# Patient Record
Sex: Male | Born: 1975 | Race: Black or African American | Hispanic: No | Marital: Single | Smoking: Current every day smoker
Health system: Southern US, Community
[De-identification: ages and names within clinical notes are randomized; demographics above are authoritative.]

## PROBLEM LIST (undated history)

## (undated) ENCOUNTER — Emergency Department: Discharge status: Absent without leave | Payer: 59

---

## 2002-04-21 ENCOUNTER — Emergency Department (HOSPITAL_COMMUNITY): Admission: EM | Admit: 2002-04-21 | Discharge: 2002-04-21 | Payer: Self-pay | Admitting: Emergency Medicine

## 2014-02-25 ENCOUNTER — Emergency Department (HOSPITAL_COMMUNITY)
Admission: EM | Admit: 2014-02-25 | Discharge: 2014-02-25 | Disposition: A | Discharge status: Home / Self Care | Payer: 59 | Attending: Emergency Medicine | Admitting: Emergency Medicine

## 2014-02-25 ENCOUNTER — Encounter (HOSPITAL_COMMUNITY): Payer: Self-pay | Admitting: Emergency Medicine

## 2014-02-25 ENCOUNTER — Emergency Department (HOSPITAL_COMMUNITY): Payer: 59

## 2014-02-25 DIAGNOSIS — T07XXXA Unspecified multiple injuries, initial encounter: Secondary | ICD-10-CM

## 2014-02-25 DIAGNOSIS — S022XXA Fracture of nasal bones, initial encounter for closed fracture: Secondary | ICD-10-CM | POA: Insufficient documentation

## 2014-02-25 DIAGNOSIS — IMO0002 Reserved for concepts with insufficient information to code with codable children: Secondary | ICD-10-CM | POA: Insufficient documentation

## 2014-02-25 DIAGNOSIS — S0510XA Contusion of eyeball and orbital tissues, unspecified eye, initial encounter: Secondary | ICD-10-CM | POA: Insufficient documentation

## 2014-02-25 DIAGNOSIS — F17200 Nicotine dependence, unspecified, uncomplicated: Secondary | ICD-10-CM | POA: Insufficient documentation

## 2014-02-25 DIAGNOSIS — S00532A Contusion of oral cavity, initial encounter: Secondary | ICD-10-CM

## 2014-02-25 DIAGNOSIS — Z23 Encounter for immunization: Secondary | ICD-10-CM | POA: Insufficient documentation

## 2014-02-25 DIAGNOSIS — S1093XA Contusion of unspecified part of neck, initial encounter: Principal | ICD-10-CM

## 2014-02-25 DIAGNOSIS — S0511XA Contusion of eyeball and orbital tissues, right eye, initial encounter: Secondary | ICD-10-CM

## 2014-02-25 DIAGNOSIS — S0512XA Contusion of eyeball and orbital tissues, left eye, initial encounter: Secondary | ICD-10-CM

## 2014-02-25 DIAGNOSIS — S0003XA Contusion of scalp, initial encounter: Secondary | ICD-10-CM | POA: Insufficient documentation

## 2014-02-25 DIAGNOSIS — S0083XA Contusion of other part of head, initial encounter: Principal | ICD-10-CM | POA: Insufficient documentation

## 2014-02-25 MED ORDER — HYDROCODONE-ACETAMINOPHEN 5-325 MG PO TABS
1.0000 | ORAL_TABLET | ORAL | Status: DC | PRN
  Administered 2014-02-25: 2 via ORAL
  Filled 2014-02-25: qty 2

## 2014-02-25 MED ORDER — TETANUS-DIPHTH-ACELL PERTUSSIS 5-2.5-18.5 LF-MCG/0.5 IM SUSP
0.5000 mL | Freq: Once | INTRAMUSCULAR | Status: AC
  Administered 2014-02-25: 0.5 mL via INTRAMUSCULAR
  Filled 2014-02-25: qty 0.5

## 2014-02-25 MED ORDER — HYDROCODONE-ACETAMINOPHEN 5-325 MG PO TABS: 1.0000 | ORAL_TABLET | ORAL | Status: AC | PRN
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
## 2016-07-09 ENCOUNTER — Emergency Department (HOSPITAL_COMMUNITY): Payer: 59

## 2016-07-09 ENCOUNTER — Encounter (HOSPITAL_COMMUNITY): Payer: Self-pay | Admitting: Emergency Medicine

## 2016-07-09 ENCOUNTER — Emergency Department (HOSPITAL_COMMUNITY)
Admission: EM | Admit: 2016-07-09 | Discharge: 2016-07-09 | Disposition: A | Discharge status: Home / Self Care | Payer: 59 | Attending: Emergency Medicine | Admitting: Emergency Medicine

## 2016-07-09 DIAGNOSIS — I951 Orthostatic hypotension: Secondary | ICD-10-CM

## 2016-07-09 DIAGNOSIS — R55 Syncope and collapse: Secondary | ICD-10-CM | POA: Diagnosis present

## 2016-07-09 DIAGNOSIS — E860 Dehydration: Secondary | ICD-10-CM | POA: Diagnosis not present

## 2016-07-09 DIAGNOSIS — F17200 Nicotine dependence, unspecified, uncomplicated: Secondary | ICD-10-CM | POA: Insufficient documentation

## 2016-07-09 LAB — CBC WITH DIFFERENTIAL/PLATELET
Basophils Absolute: 0 10*3/uL (ref 0.0–0.1)
Basophils Relative: 0 %
EOS ABS: 0.1 10*3/uL (ref 0.0–0.7)
Eosinophils Relative: 1 %
HEMATOCRIT: 39.7 % (ref 39.0–52.0)
HEMOGLOBIN: 12.8 g/dL — AB (ref 13.0–17.0)
Lymphocytes Relative: 20 %
Lymphs Abs: 1.9 10*3/uL (ref 0.7–4.0)
MCH: 29 pg (ref 26.0–34.0)
MCHC: 32.2 g/dL (ref 30.0–36.0)
MCV: 89.8 fL (ref 78.0–100.0)
MONOS PCT: 7 %
Monocytes Absolute: 0.7 10*3/uL (ref 0.1–1.0)
NEUTROS ABS: 6.9 10*3/uL (ref 1.7–7.7)
NEUTROS PCT: 72 %
Platelets: 214 10*3/uL (ref 150–400)
RBC: 4.42 MIL/uL (ref 4.22–5.81)
RDW: 13.3 % (ref 11.5–15.5)
WBC: 9.6 10*3/uL (ref 4.0–10.5)

## 2016-07-09 LAB — URINALYSIS, ROUTINE W REFLEX MICROSCOPIC
GLUCOSE, UA: NEGATIVE mg/dL
Hgb urine dipstick: NEGATIVE
KETONES UR: 15 mg/dL — AB
LEUKOCYTES UA: NEGATIVE
Nitrite: NEGATIVE
Protein, ur: 30 mg/dL — AB
Specific Gravity, Urine: 1.025 (ref 1.005–1.030)
pH: 5.5 (ref 5.0–8.0)

## 2016-07-09 LAB — COMPREHENSIVE METABOLIC PANEL
ALBUMIN: 4.1 g/dL (ref 3.5–5.0)
ALK PHOS: 58 U/L (ref 38–126)
ALT: 22 U/L (ref 17–63)
AST: 25 U/L (ref 15–41)
Anion gap: 9 (ref 5–15)
BILIRUBIN TOTAL: 0.5 mg/dL (ref 0.3–1.2)
BUN: 18 mg/dL (ref 6–20)
CALCIUM: 8.4 mg/dL — AB (ref 8.9–10.3)
CO2: 22 mmol/L (ref 22–32)
CREATININE: 1.39 mg/dL — AB (ref 0.61–1.24)
Chloride: 104 mmol/L (ref 101–111)
GFR calc Af Amer: >60 mL/min (ref >60)
GFR calc non Af Amer: >60 mL/min (ref >60)
GLUCOSE: 105 mg/dL — AB (ref 65–99)
Potassium: 3.1 mmol/L — ABNORMAL LOW (ref 3.5–5.1)
SODIUM: 135 mmol/L (ref 135–145)
Total Protein: 6.3 g/dL — ABNORMAL LOW (ref 6.5–8.1)

## 2016-07-09 LAB — MAGNESIUM: Magnesium: 2.1 mg/dL (ref 1.7–2.4)

## 2016-07-09 LAB — URINE MICROSCOPIC-ADD ON
Bacteria, UA: NONE SEEN
RBC / HPF: NONE SEEN RBC/hpf (ref 0–5)

## 2016-07-09 LAB — CBG MONITORING, ED: Glucose-Capillary: 110 mg/dL — ABNORMAL HIGH (ref 65–99)

## 2016-07-09 MED ORDER — SODIUM CHLORIDE 0.9 % IV BOLUS (SEPSIS)
1000.0000 mL | Freq: Once | INTRAVENOUS | Status: AC
  Administered 2016-07-09: 1000 mL via INTRAVENOUS

## 2016-07-09 MED ORDER — LACTATED RINGERS IV BOLUS (SEPSIS)
1000.0000 mL | Freq: Once | INTRAVENOUS | Status: AC
  Administered 2016-07-09: 1000 mL via INTRAVENOUS
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
## 2016-07-09 NOTE — ED Notes (Signed)
Report received from WelcomeLynnze, RN at this time. I agree with previous nurses assessment with no changes noted.

## 2016-07-09 NOTE — ED Notes (Signed)
Pt able to ambulate efficently with no discomfort

## 2016-07-09 NOTE — ED Notes (Signed)
Pt provided with d/c instructions at this time. Pt verbalizes understanding of d/c instructions as well as follow up procedure after d/c. No new RX at time of d/c.  Pt in no apparent distress at this time. Pt ambulatory at time of d/c.   

## 2016-07-09 NOTE — ED Notes (Signed)
Pt returned from CT at this time via ED stretcher. Pt in no apparent distress at this time. Will continue to closely monitor pt.   

## 2016-07-09 NOTE — ED Notes (Signed)
CBG 110. 
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
## 2016-07-09 NOTE — ED Notes (Signed)
Patient transported to CT at this time via ED stretcher. Pt in no apparent distress at this time.   
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
## 2017-09-25 IMAGING — CT CT HEAD W/O CM
3 of 4 series · 18 of 47 positions shown, 21 images · non-contrast
Comparison: Prior CT scan of the head and face 02/25/2014

CLINICAL DATA: 39-year-old male with syncopal episode

EXAM:
CT HEAD WITHOUT CONTRAST
TECHNIQUE: Contiguous axial images were obtained from the base of the skull
through the vertex without intravenous contrast.

[Series 201: head w/o, idose (1) · axial · non-contrast · 0.45mm/px · z∈[+145,+270]mm · 12 of 31 slices shown, 15 images]
[im 3/31  brain]
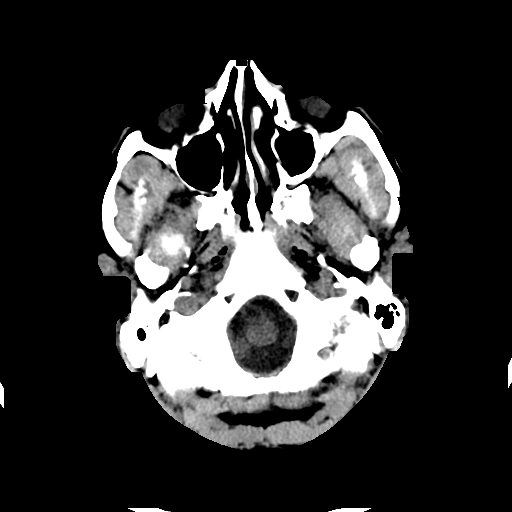
[im 3/31  bone]
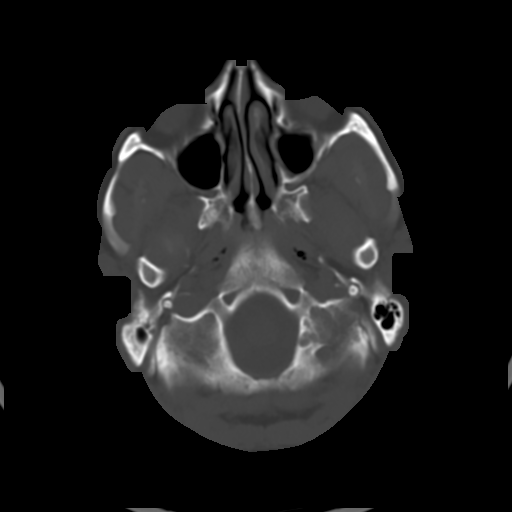
[im 5/31  brain]
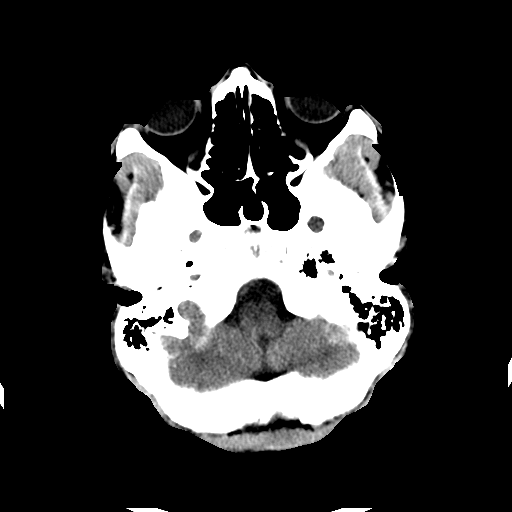
[im 7/31  brain]
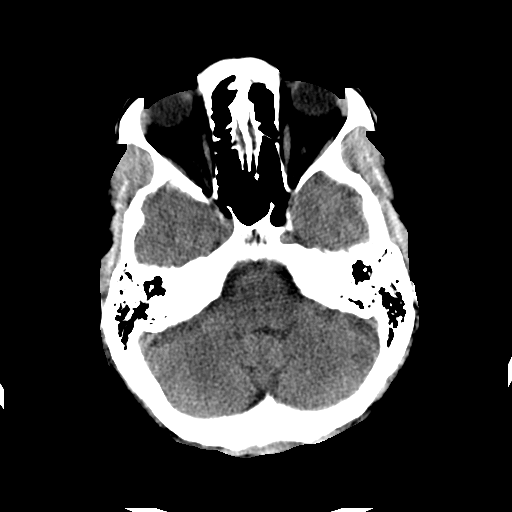
[im 9/31  brain]
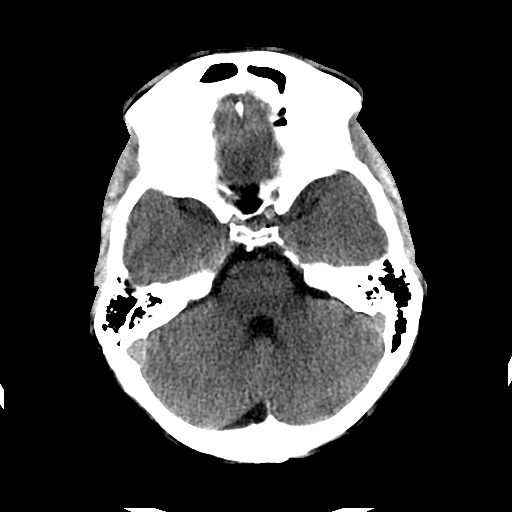
[im 11/31  brain]
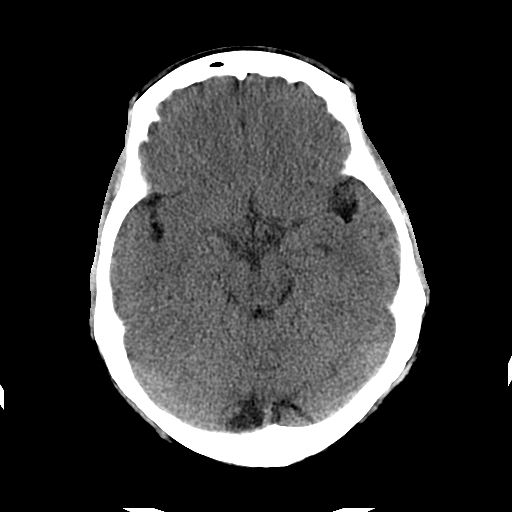
[im 11/31  bone]
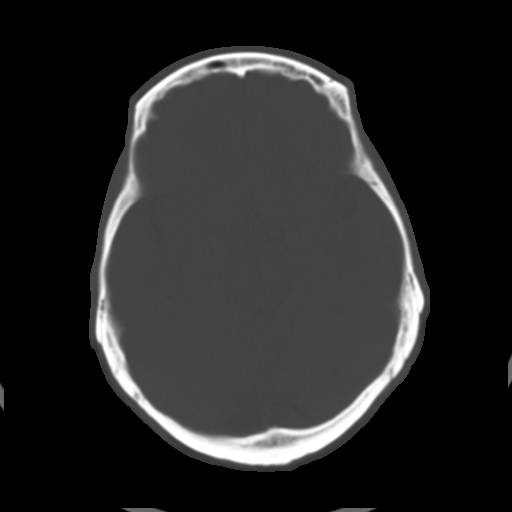
[im 13/31  brain]
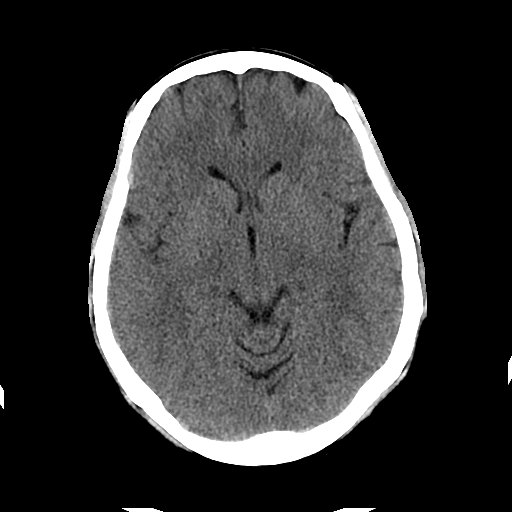
[im 18/31  brain]
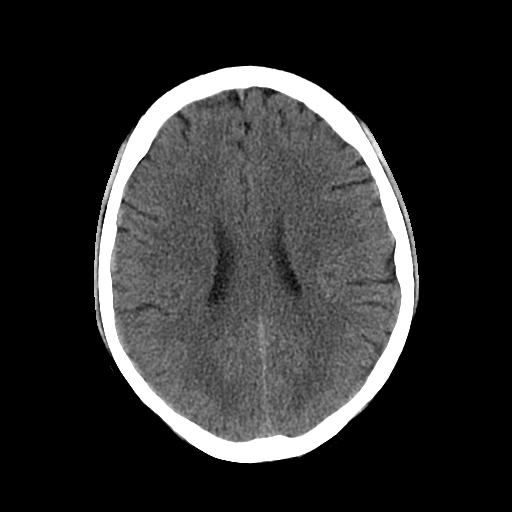
[im 20/31  brain]
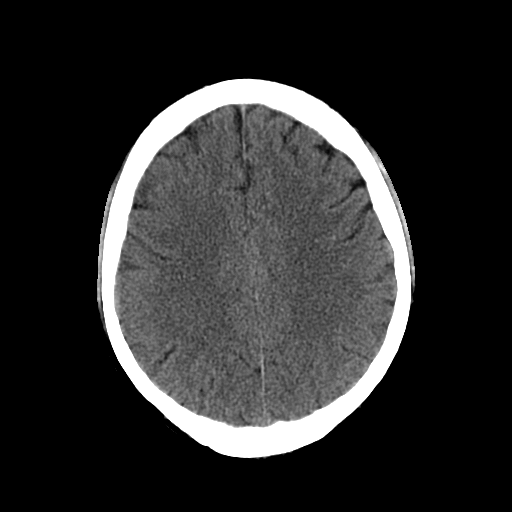
[im 22/31  brain]
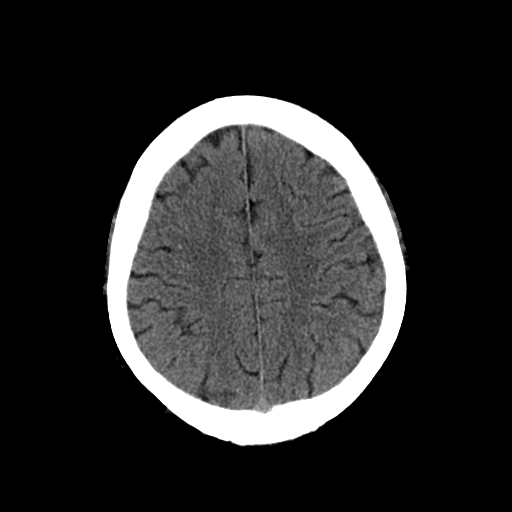
[im 22/31  bone]
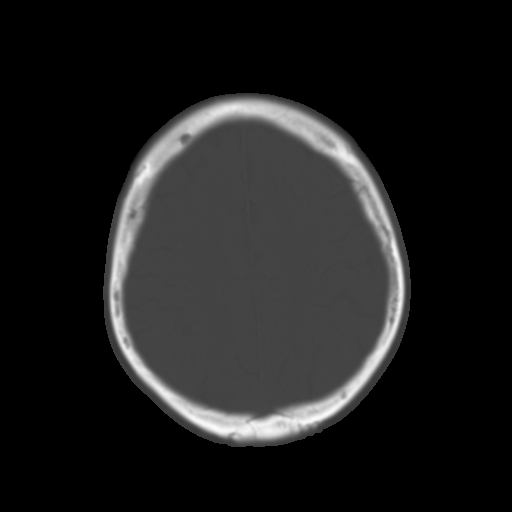
[im 24/31  brain]
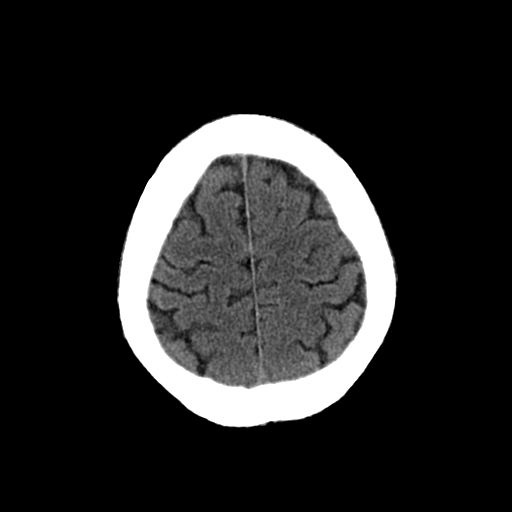
[im 26/31  brain]
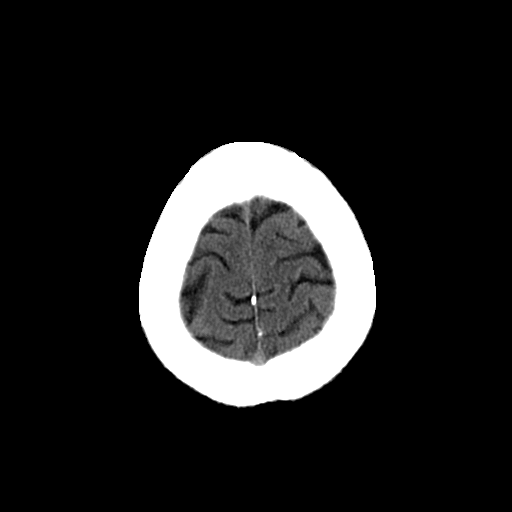
[im 28/31  brain]
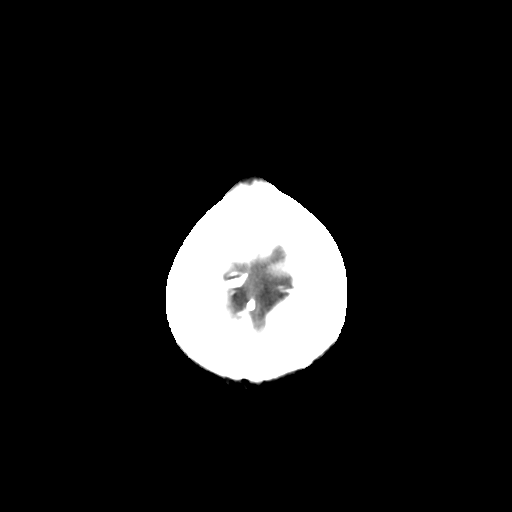

[Series 203: coronal st, idose (1) · coronal · 0.40mm/px · 3 of 71 slices shown]
[im 24/71  brain]
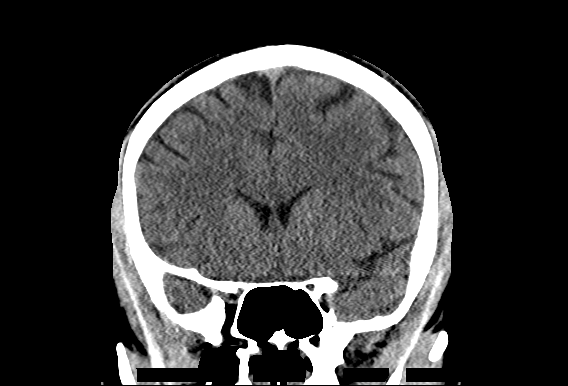
[im 32/71  brain]
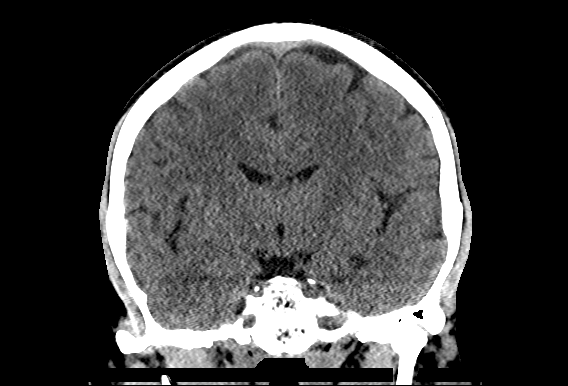
[im 39/71  brain]
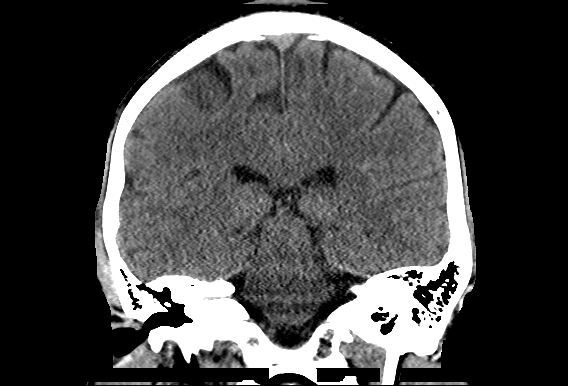

[Series 204: sagittal st, idose (1) · sagittal · 0.40mm/px · 3 of 76 slices shown]
[im 26/76  brain]
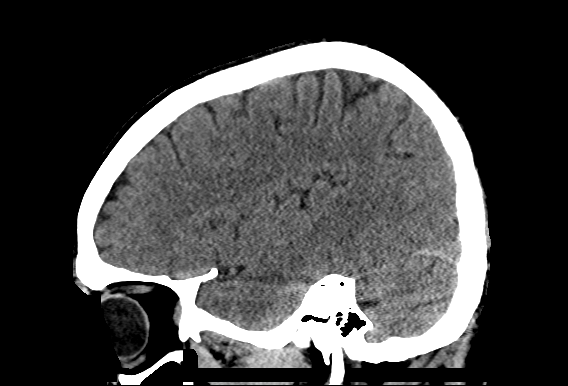
[im 38/76  brain]
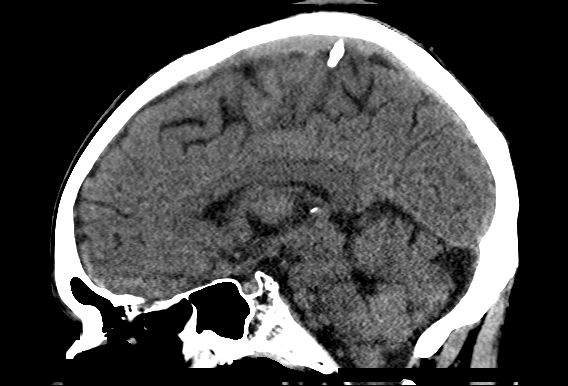
[im 51/76  brain]
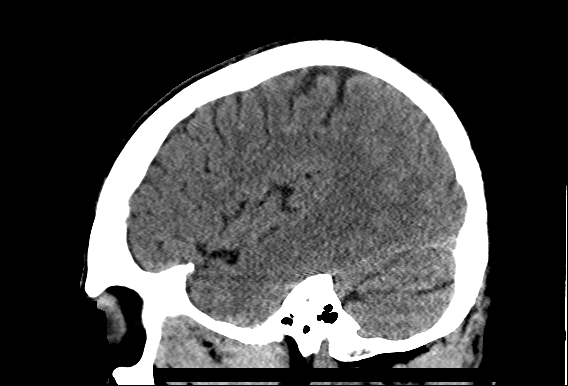

[18 of 47 positions shown; findings below may reference images not displayed]

FINDINGS: Brain: No evidence of acute infarction, hemorrhage, hydrocephalus,
extra-axial collection or mass lesion/mass effect.

Vascular: No hyperdense vessel or unexpected calcification.

Skull: Normal. Negative for fracture or focal lesion.

Sinuses/Orbits: No acute finding.

Other: None
IMPRESSION: Negative head CT.

## 2022-06-16 ENCOUNTER — Ambulatory Visit (INDEPENDENT_AMBULATORY_CARE_PROVIDER_SITE_OTHER): Payer: BC Managed Care – PPO

## 2022-06-16 ENCOUNTER — Encounter (HOSPITAL_COMMUNITY): Payer: Self-pay | Admitting: Emergency Medicine

## 2022-06-16 ENCOUNTER — Ambulatory Visit (HOSPITAL_COMMUNITY)
Admission: EM | Admit: 2022-06-16 | Discharge: 2022-06-16 | Disposition: A | Discharge status: Home / Self Care | Payer: BC Managed Care – PPO | Attending: Internal Medicine | Admitting: Internal Medicine

## 2022-06-16 DIAGNOSIS — M79645 Pain in left finger(s): Secondary | ICD-10-CM | POA: Diagnosis not present

## 2022-06-16 NOTE — ED Provider Notes (Signed)
[facility]    CSN: [id number] Arrival date & time: 06/16/22  1916      History   Chief Complaint Chief Complaint  Patient presents with   Finger Injury    HPI Joseph Turner is a 46 y.o. male.   Pt presents with left thumb pain.  Reports he slammed his thumb in a door last night around 6pm.  Pain with movement today along with some swelling.  He has a small laceration, bleeding controlled.  No other injuries.  Pt is left handed.  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     History reviewed. No pertinent past medical history.  There are no problems to display for this patient.   History reviewed. No pertinent surgical history.     Home Medications    Prior to Admission medications   Medication Sig Start Date End Date Taking? Authorizing Provider  HYDROcodone-acetaminophen (NORCO/VICODIN) 5-325 MG per tablet Take 1-2 tablets by mouth every 4 (four) hours as needed for moderate pain. Patient not taking: Reported on 07/09/2016 02/25/14   Marisa Severin, MD    Family History History reviewed. No pertinent family history.  Social History Social History   Tobacco Use   Smoking status: Every Day   Smokeless tobacco: Never  Substance Use Topics   Alcohol use: Yes    Comment: socially   Drug use: No     Allergies   Patient has no known allergies.   Review of Systems Review of Systems  Constitutional:  Negative for chills and fever.  HENT:  Negative for ear pain and sore throat.   Eyes:  Negative for pain and visual disturbance.  Respiratory:  Negative for cough and shortness of breath.   Cardiovascular:  Negative for chest pain and palpitations.  Gastrointestinal:  Negative for abdominal pain and vomiting.  Genitourinary:  Negative for dysuria and hematuria.  Musculoskeletal:  Positive for arthralgias (left thumb pain). Negative for back pain.  Skin:  Negative for color change and rash.  Neurological:  Negative for seizures  and syncope.  All other systems reviewed and are negative.    Physical Exam Triage Vital Signs ED Triage Vitals  Enc Vitals Group     BP 06/16/22 1925 (!) 145/93     Pulse Rate 06/16/22 1925 95     Resp 06/16/22 1925 16     Temp 06/16/22 1925 98.9 F (37.2 C)     Temp Source 06/16/22 1925 Oral     SpO2 06/16/22 1925 100 %     Weight --      Height --      Head Circumference --      Peak Flow --      Pain Score 06/16/22 1928 5     Pain Loc --      Pain Edu? --      Excl. in GC? --    No data found.  Updated Vital Signs BP (!) 145/93 (BP Location: Right Arm)   Pulse 95   Temp 98.9 F (37.2 C) (Oral)   Resp 16   SpO2 100%   Visual Acuity Right Eye Distance:   Left Eye Distance:   Bilateral Distance:    Right Eye Near:   Left Eye Near:    Bilateral Near:     Physical Exam Vitals and nursing note reviewed.  Constitutional:      General: He is not in acute distress.    Appearance: He  is well-developed.  HENT:     Head: Normocephalic and atraumatic.  Eyes:     Conjunctiva/sclera: Conjunctivae normal.  Cardiovascular:     Rate and Rhythm: Normal rate and regular rhythm.     Heart sounds: No murmur heard. Pulmonary:     Effort: Pulmonary effort is normal. No respiratory distress.     Breath sounds: Normal breath sounds.  Abdominal:     Palpations: Abdomen is soft.     Tenderness: There is no abdominal tenderness.  Musculoskeletal:        General: No swelling.     Cervical back: Neck supple.     Comments: Left thumb with swelling and TTP over proximal phalanx. Small linear laceration, superficial, no bleeding.   Skin:    General: Skin is warm and dry.     Capillary Refill: Capillary refill takes less than 2 seconds.  Neurological:     Mental Status: He is alert.  Psychiatric:        Mood and Affect: Mood normal.      UC Treatments / Results  Labs (all labs ordered are listed, but only abnormal results are displayed) Labs Reviewed - No data to  display  EKG   Radiology DG Finger Thumb Left  Result Date: 06/16/2022 CLINICAL DATA:  Thumb was slammed in car door. Pain at the first PIP joint. EXAM: LEFT THUMB 2+V COMPARISON:  None Available. FINDINGS: There is no evidence of fracture or dislocation. There is no evidence of arthropathy or other focal bone abnormality. Soft tissues are unremarkable. IMPRESSION: Negative. Electronically Signed   By: Signa Kell M.D.   On: 06/16/2022 19:43    Procedures Procedures (including critical care time)  Medications Ordered in UC Medications - No data to display  Initial Impression / Assessment and Plan / UC Course  I have reviewed the triage vital signs and the nursing notes.  Pertinent labs & imaging results that were available during my care of the patient were reviewed by me and considered in my medical decision making (see chart for details).     Left thumb pain and swelling, no fracture on xray.  Superficial laceration, cleaned in clinic.  Advised ice, elevation, NSAIDS as needed.   Final Clinical Impressions(s) / UC Diagnoses   Final diagnoses:  Thumb pain, left     Discharge Instructions      No fracture noted on xray.  Recommend ice, elevation, and NSAIDS as needed for pain.       ED Prescriptions   None    PDMP not reviewed this encounter.   Ward, Tylene Fantasia, PA-C 06/16/22 1953

## 2022-06-16 NOTE — ED Triage Notes (Signed)
Slammed left thumb in door last night around 6 pm last night. Today, thumb is bruised, swollen, and tender to touch. Patient resistant to move finger in triage d/t pain. Small laceration to thumb tuft that appears closed, no drainage from site.

## 2022-06-16 NOTE — Discharge Instructions (Addendum)
No fracture noted on xray.  Recommend ice, elevation, and NSAIDS as needed for pain.
